# Patient Record
Sex: Male | Born: 1980
Health system: Midwestern US, Community
[De-identification: ages and names within clinical notes are randomized; demographics above are authoritative.]

---

## 2013-08-03 NOTE — Patient Instructions (Signed)
Toenail or Fingernail Avulsion: After Your Visit  Your Care Instructions  Losing a toenail or fingernail because of an injury is called avulsion. The nail may be completely or partially torn off after a trauma to the area.  Your doctor may have removed the nail, put part of it back into place, or repaired the nail bed. Your toe or finger may be sore after treatment. You may have stitches.  You may have some swelling, color changes, and bloody crusting on or around the wound for 2 or 3 days. This is normal. Taking good care of your wound at home will help it heal quickly and reduce your chance of infection.  The wound should heal within a few weeks. If completely removed, fingernails may take 6 months to grow back. Toenails may take 12 to 18 months to grow back. Injured nails may look different when they grow back.  Follow-up care is a key part of your treatment and safety. Be sure to make and go to all appointments, and call your doctor if you are having problems. It's also a good idea to know your test results and keep a list of the medicines you take.  How can you care for yourself at home?  ?? If possible, prop up the injured area on a pillow anytime you sit or lie down during the next 3 days. Try to keep it above the level of your heart. This will help reduce swelling.  ?? Leave the bandage on, and if you have stitches, do not get them wet for the first 24 to 48 hours. Use a plastic bag to cover the area when you shower.  ?? After the first 24 to 48 hours, you can remove the bandage and gently wash the wound. If the bandage sticks to the wound, use warm water to loosen it. Do not scrub or soak the area. Do not go swimming.  ?? Replace the bandage with a clean one at least one time a day and whenever the old one gets wet or dirty. If a small wound is not likely to get dirty, is not draining, and is not in an area where clothing will rub it, you may not need a bandage.  ?? If the doctor told you to use an antibiotic  ointment, such as polymyxin B sulfate (Polysporin) or bacitracin, use it exactly as directed. Do not use an antibiotic ointment that contains neomycin (such as Neosporin), because it can cause an allergic reaction.  ?? Do not use rubbing alcohol, hydrogen peroxide, iodine, or Mercurochrome on the wound. These can harm the tissue.  ?? If you have stitches, do not remove them on your own. Your doctor will tell you when to return to have the stitches removed.  ?? Be safe with medicines. Take pain medicines exactly as directed.  ?? If the doctor gave you a prescription medicine for pain, take it as prescribed.  ?? If you are not taking a prescription pain medicine, ask your doctor if you can take an over-the-counter medicine.  ?? If your doctor prescribed antibiotics, take them as directed. Do not stop taking them just because you feel better. You need to take the full course of antibiotics.  When should you call for help?  Call your doctor now or seek immediate medical care if:  ?? The skin near the wound is cool or pale or changes color.  ?? The wound starts to bleed, and blood soaks through the bandage. Oozing small amounts of   blood is normal.  ?? You have signs of infection, such as:  ?? Increased pain, swelling, warmth, or redness.  ?? Red streaks leading from your toe or finger.  ?? Pus draining from your toe or finger.  ?? Swollen lymph nodes in your neck, armpits, or groin.  ?? A fever.  Watch closely for changes in your health, and be sure to contact your doctor if:  ?? You have problems with the nail as it grows back.  ?? You do not get better as expected.   Where can you learn more?   Go to [email or web address] and sign in to your MyChart account. Enter Z629 in the Search Health Information box to learn more about ???Toenail or Fingernail Avulsion: After Your Visit.???    If you do not have an account, please click on the ???Sign Up Now??? link.     ?? 2006-2015 [employer]. Care instructions adapted  under license by [facility]. This care instruction is for use with your licensed healthcare professional. If you have questions about a medical condition or this instruction, always ask your healthcare professional. [employer] disclaims any warranty or liability for your use of this information.  Content Version: 10.4.390249; Current as of: September 03, 2012

## 2013-08-03 NOTE — Progress Notes (Signed)
Subjective:      Patient ID: Tom Davis is a 33 y.o. male.    Foot Injury   The incident occurred 1 to 3 hours ago. The incident occurred at home. Injury mechanism: Struck furniture with left great toe and partiallly ripped toenail off. Pain location: Left great toe. The pain is at a severity of 6/10. The pain is moderate. The pain has been constant since onset. Pertinent negatives include no inability to bear weight, loss of motion, loss of sensation, muscle weakness, numbness or tingling. He reports no foreign bodies present. The symptoms are aggravated by weight bearing. He has tried nothing for the symptoms.       Review of Systems   Constitutional: Negative for fever and chills.   HENT: Negative for ear pain, rhinorrhea, sore throat and trouble swallowing.    Eyes: Negative for pain and discharge.   Respiratory: Negative for cough, shortness of breath and wheezing.    Cardiovascular: Negative for chest pain.   Gastrointestinal: Negative for nausea, vomiting, abdominal pain and diarrhea.   Genitourinary: Negative for dysuria, urgency, frequency and difficulty urinating.   Musculoskeletal: Negative for myalgias and arthralgias.        Left great toenail injury   Skin: Negative for rash and wound.   Neurological: Negative for tingling, weakness, numbness and headaches.   Hematological: Does not bruise/bleed easily.   All other systems reviewed and are negative.      Objective:   Physical Exam   Constitutional: He is oriented to person, place, and time. He appears well-developed and well-nourished. No distress.   HENT:   Head: Normocephalic and atraumatic.   Right Ear: Tympanic membrane, external ear and ear canal normal.   Left Ear: Tympanic membrane, external ear and ear canal normal.   Mouth/Throat: Oropharynx is clear and moist. No oropharyngeal exudate.   Eyes: Conjunctivae and EOM are normal. Pupils are equal, round, and reactive to light. No scleral icterus.   Neck: Normal range of motion. Neck supple.  No tracheal deviation present. No thyromegaly present.   Cardiovascular: Normal rate, regular rhythm and normal heart sounds.  Exam reveals no gallop and no friction rub.    No murmur heard.  Pulmonary/Chest: Effort normal and breath sounds normal. No respiratory distress. He has no wheezes. He has no rales. He exhibits no tenderness.   Abdominal: Soft. Bowel sounds are normal. He exhibits no distension and no mass. There is no tenderness. There is no rebound and no guarding.   Musculoskeletal: Normal range of motion. He exhibits no edema or tenderness.   Lymphadenopathy:     He has no cervical adenopathy.   Neurological: He is alert and oriented to person, place, and time. He has normal strength. No sensory deficit.   Skin: Skin is warm and dry. No lesion and no rash noted.   Partial avulsion of the left great toenail. No deformity of the toe. No neurovascular deficits.   Psychiatric: He has a normal mood and affect. His behavior is normal.   Vitals reviewed.      Assessment:      1. Toenail avulsion, initial encounter             Plan:      Tom Davis was seen today for nail problem.    Diagnoses and associated orders for this visit:    Toenail avulsion, initial encounter  - NAIL REMOVAL        Procedure    Type of Procedure:  Laceration  repair   I & D   x Other __Toenail removal____________________________________    Wound Location: left great toe  Size: n/a    Informed consent obtained from: patient    Last Tetanus vaccination: utd    Laceration depth: superficial     Shape/Condition: clean wound edges    Abscess / Paronychia: Neuro / Vascular / Tendon:  Fluctuant xDistal motor and sensory function intact  Indurated Tendon visible and intact through full ROM  Central eschar xNo tendon located in area of this wound     Drainage Purulent  Bleeding Abnormal:     Anesthesia:   Local ________ ml  x Digital block     x Lidocaine: 1%  w/ Epi   w/ Bicarb  x Other __5 mls used for digital block_____________________________         Skin Prep: Chlorhexadine. Sterile Technique used.     Abscess I&D:    ______ cm Incision using #_____ scalpel  Probed cavity with sterile forceps   Purulent drainage obtained  Wound culture performed   Cavity packed with     Wound Prep: cleansed with chlorhexidine gluconate    Foreign material / body removed   Completely  Partially     Explored in bloodless field     Repair: Simple closure   Dermabond / Tissue adhesive  Interrupted   Layered closure   Running   H Mattress   V Mattress    Retention Suture      #_______   ______-0 Nylon       #_______   ______-0 Prolene       #_______   ______-0 Vicryl       #_______   Cherlynn PoloStaples       Complexity: x Minimal  Intermediate  Complex  Assessment: x Well-tolerated  x No complications xGood hemostasis  Recheck / removal: as needed days

## 2023-01-25 IMAGING — CR C-SPINE 2 or 3 views
1 series · 3 of 3 positions shown · non-contrast
Comparison: None

Cervical spine radiographs, 3 views
INDICATION: Arthritis. Neck pain.

[Series 1: lat · 0.17mm/px · 3 of 3 slices shown]
[im 1/3]
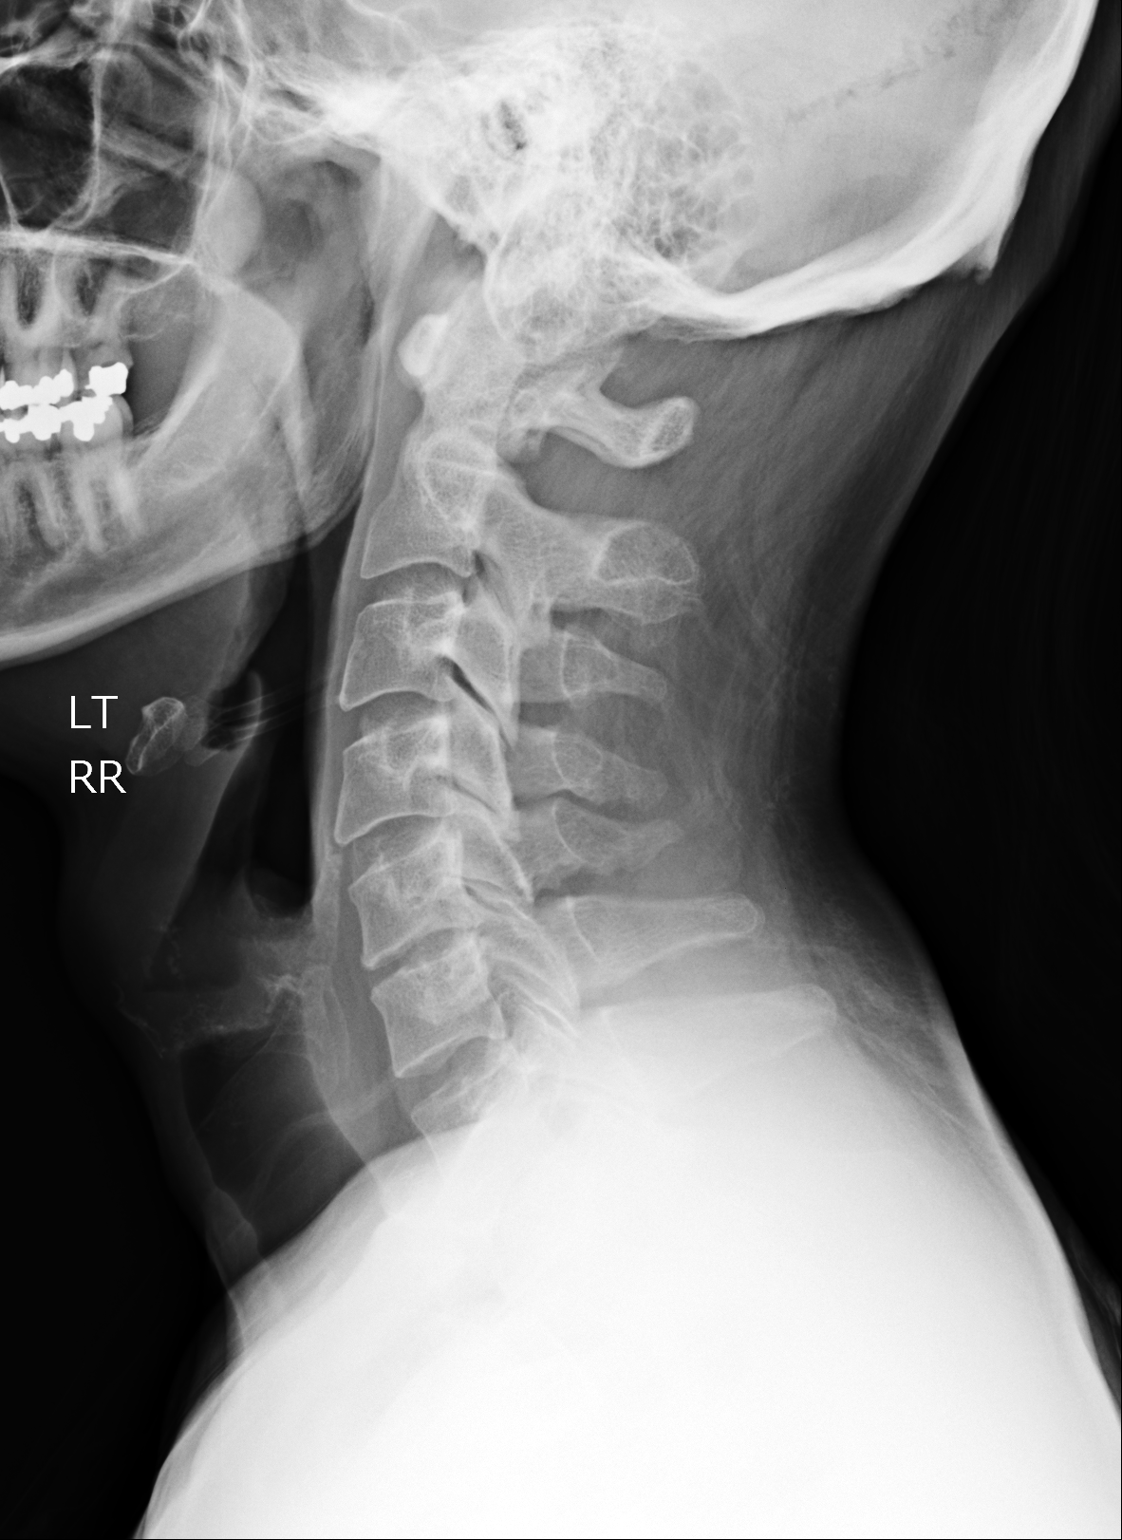
[im 2/3]
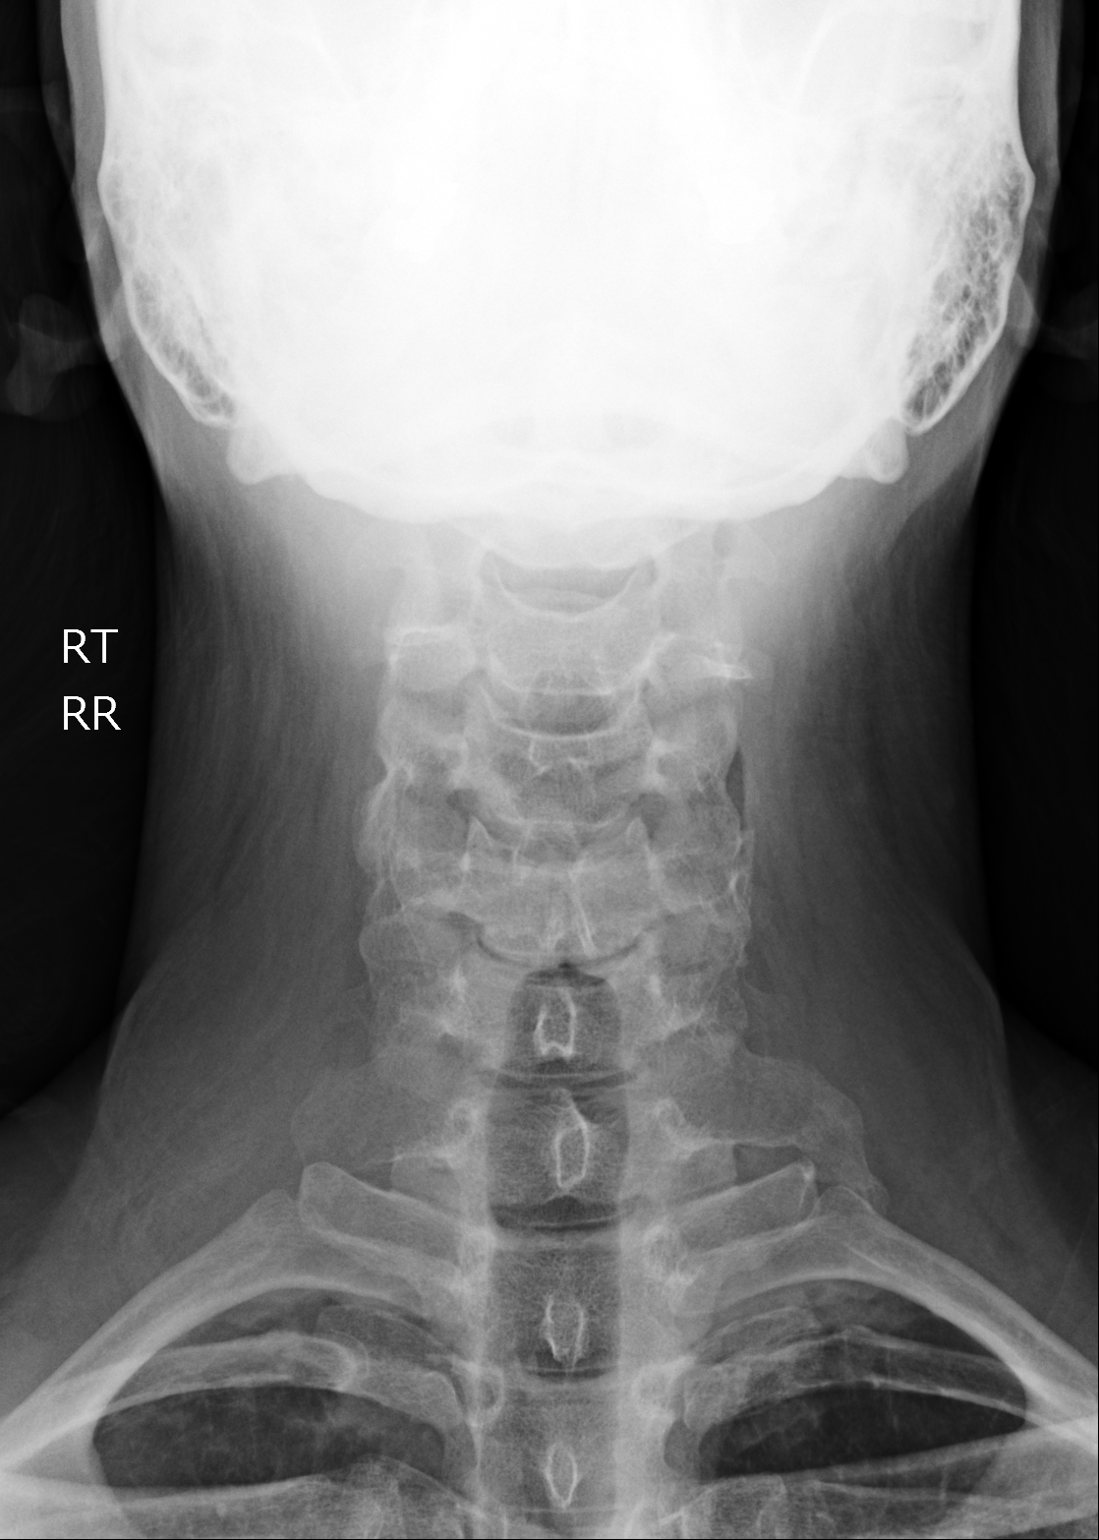
[im 3/3]
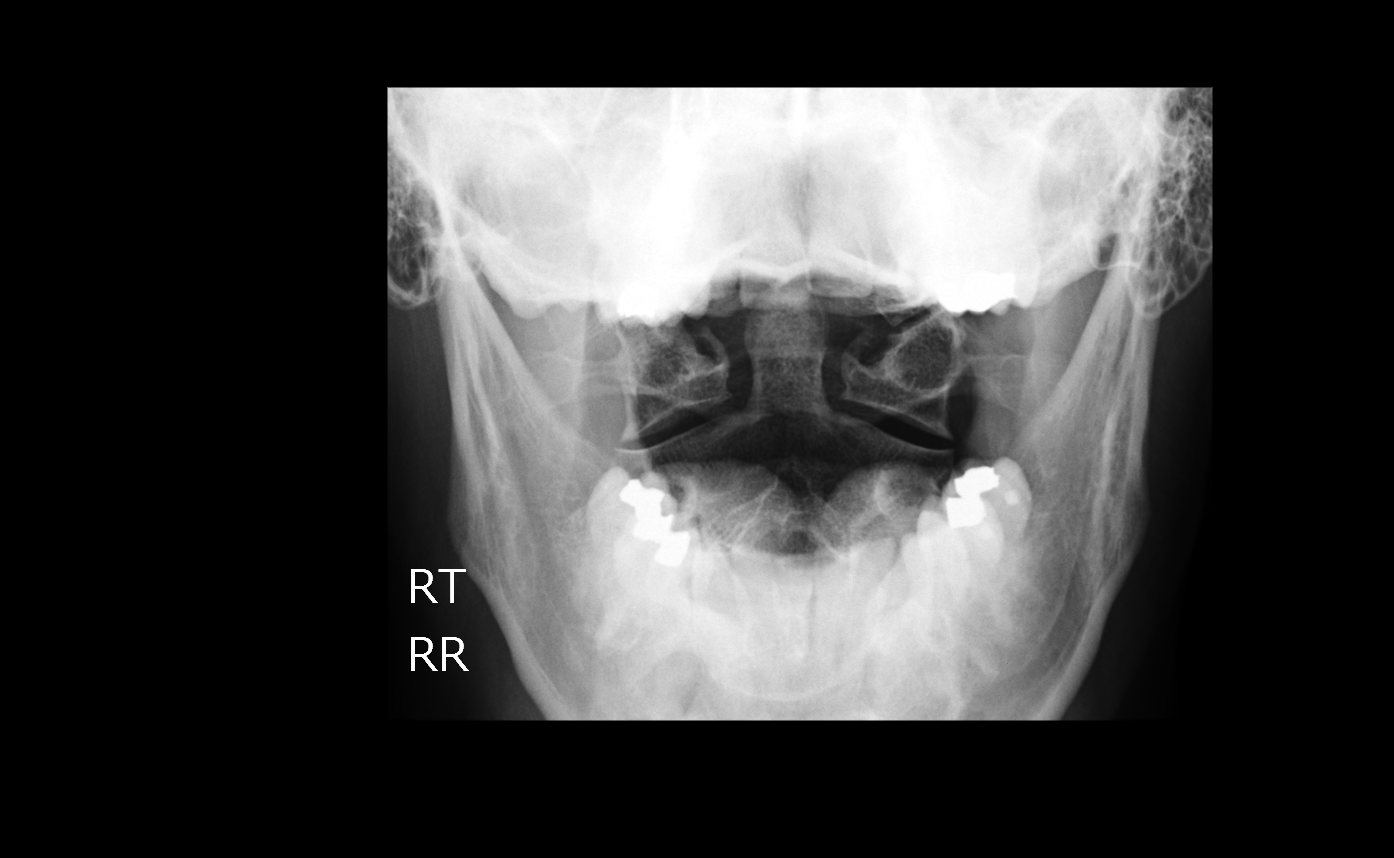

[3 of 3 positions shown; findings below may reference images not displayed]

FINDINGS: Normal mineralization. No fractures. Normal alignment. Degenerative loss of disc space height C5-6. Mild facet arthropathy. Dens intact. Prevertebral soft tissues normal.
IMPRESSION: Degenerative loss of disc space height at C5-6. Mild facet arthropathy.
